# Patient Record
Sex: Female | Born: 1945 | Race: White | Hispanic: No | Marital: Married | Smoking: Never smoker
Health system: Southern US, Community
[De-identification: ages and names within clinical notes are randomized; demographics above are authoritative.]

## PROBLEM LIST (undated history)

## (undated) DIAGNOSIS — E119 Type 2 diabetes mellitus without complications: Secondary | ICD-10-CM

## (undated) DIAGNOSIS — F419 Anxiety disorder, unspecified: Secondary | ICD-10-CM

## (undated) DIAGNOSIS — K219 Gastro-esophageal reflux disease without esophagitis: Secondary | ICD-10-CM

## (undated) HISTORY — PX: NOSE SURGERY: SHX723

## (undated) HISTORY — PX: FOOT SURGERY: SHX648

## (undated) HISTORY — DX: Anxiety disorder, unspecified: F41.9

## (undated) HISTORY — PX: OTHER SURGICAL HISTORY: SHX169

## (undated) HISTORY — DX: Type 2 diabetes mellitus without complications: E11.9

## (undated) HISTORY — DX: Gastro-esophageal reflux disease without esophagitis: K21.9

---

## 2005-02-24 ENCOUNTER — Emergency Department: Payer: Self-pay | Admitting: Unknown Physician Specialty

## 2005-03-09 ENCOUNTER — Ambulatory Visit: Payer: Self-pay | Admitting: Internal Medicine

## 2005-03-26 ENCOUNTER — Ambulatory Visit: Payer: Self-pay | Admitting: Internal Medicine

## 2005-03-28 ENCOUNTER — Ambulatory Visit: Payer: Self-pay | Admitting: Internal Medicine

## 2005-05-27 ENCOUNTER — Ambulatory Visit: Payer: Self-pay

## 2006-04-21 ENCOUNTER — Ambulatory Visit: Payer: Self-pay | Admitting: Obstetrics and Gynecology

## 2007-04-25 ENCOUNTER — Ambulatory Visit: Payer: Self-pay | Admitting: Obstetrics and Gynecology

## 2007-07-04 ENCOUNTER — Ambulatory Visit: Payer: Self-pay | Admitting: Internal Medicine

## 2007-07-27 ENCOUNTER — Ambulatory Visit: Payer: Self-pay | Admitting: Specialist

## 2007-12-06 ENCOUNTER — Ambulatory Visit: Payer: Self-pay | Admitting: Specialist

## 2008-07-05 ENCOUNTER — Ambulatory Visit: Payer: Self-pay | Admitting: Obstetrics and Gynecology

## 2009-07-24 ENCOUNTER — Ambulatory Visit: Payer: Self-pay | Admitting: Obstetrics and Gynecology

## 2009-10-08 ENCOUNTER — Ambulatory Visit: Payer: Self-pay

## 2009-10-29 ENCOUNTER — Ambulatory Visit: Payer: Self-pay | Admitting: Pain Medicine

## 2009-11-25 ENCOUNTER — Ambulatory Visit: Payer: Self-pay | Admitting: Physician Assistant

## 2009-11-26 ENCOUNTER — Ambulatory Visit: Payer: Self-pay | Admitting: Pain Medicine

## 2009-12-12 ENCOUNTER — Ambulatory Visit: Payer: Self-pay | Admitting: Physician Assistant

## 2009-12-31 ENCOUNTER — Ambulatory Visit: Payer: Self-pay | Admitting: Pain Medicine

## 2010-01-13 ENCOUNTER — Ambulatory Visit: Payer: Self-pay | Admitting: Pain Medicine

## 2010-04-02 ENCOUNTER — Ambulatory Visit: Payer: Self-pay | Admitting: Internal Medicine

## 2010-07-28 ENCOUNTER — Ambulatory Visit: Payer: Self-pay | Admitting: Obstetrics and Gynecology

## 2011-09-09 ENCOUNTER — Ambulatory Visit: Payer: Self-pay | Admitting: Obstetrics and Gynecology

## 2011-12-06 ENCOUNTER — Emergency Department: Payer: Self-pay | Admitting: Unknown Physician Specialty

## 2011-12-16 ENCOUNTER — Ambulatory Visit: Payer: Self-pay | Admitting: Internal Medicine

## 2012-05-31 LAB — BASIC METABOLIC PANEL
Anion Gap: 9 (ref 7–16)
Calcium, Total: 9.5 mg/dL (ref 8.5–10.1)
Co2: 28 mmol/L (ref 21–32)
EGFR (African American): >60
EGFR (Non-African Amer.): >60
Glucose: 85 mg/dL (ref 65–99)
Potassium: 4.5 mmol/L (ref 3.5–5.1)
Sodium: 139 mmol/L (ref 136–145)

## 2012-05-31 LAB — CBC
HCT: 41.1 % (ref 35.0–47.0)
HGB: 13.8 g/dL (ref 12.0–16.0)
MCHC: 33.5 g/dL (ref 32.0–36.0)
RBC: 4.24 10*6/uL (ref 3.80–5.20)
RDW: 12.9 % (ref 11.5–14.5)

## 2012-05-31 LAB — CK TOTAL AND CKMB (NOT AT ARMC)
CK, Total: 39 U/L (ref 21–215)
CK-MB: <0.5 ng/mL — ABNORMAL LOW (ref 0.5–3.6)

## 2012-05-31 LAB — TROPONIN I: Troponin-I: <0.02 ng/mL

## 2012-06-01 ENCOUNTER — Observation Stay: Payer: Self-pay | Admitting: Internal Medicine

## 2012-06-01 LAB — T4, FREE: Free Thyroxine: 1.4 ng/dL (ref 0.76–1.46)

## 2012-06-01 LAB — LIPID PANEL
Cholesterol: 125 mg/dL (ref 0–200)
Ldl Cholesterol, Calc: 42 mg/dL (ref 0–100)

## 2012-06-01 LAB — TSH: Thyroid Stimulating Horm: 5.65 u[IU]/mL — ABNORMAL HIGH

## 2012-06-01 LAB — CK TOTAL AND CKMB (NOT AT ARMC)
CK, Total: 36 U/L (ref 21–215)
CK, Total: 45 U/L (ref 21–215)
CK-MB: <0.5 ng/mL — ABNORMAL LOW (ref 0.5–3.6)

## 2012-06-01 LAB — HEMOGLOBIN A1C: Hemoglobin A1C: 6.2 % (ref 4.2–6.3)

## 2012-09-12 ENCOUNTER — Ambulatory Visit: Payer: Self-pay | Admitting: Obstetrics and Gynecology

## 2013-07-27 ENCOUNTER — Ambulatory Visit: Payer: Self-pay | Admitting: Internal Medicine

## 2013-09-02 ENCOUNTER — Emergency Department: Payer: Self-pay | Admitting: Emergency Medicine

## 2013-11-09 ENCOUNTER — Ambulatory Visit: Payer: Self-pay | Admitting: Obstetrics and Gynecology

## 2014-02-01 ENCOUNTER — Encounter: Payer: Self-pay | Admitting: Podiatrist

## 2014-02-01 ENCOUNTER — Ambulatory Visit (INDEPENDENT_AMBULATORY_CARE_PROVIDER_SITE_OTHER): Payer: Medicare Other

## 2014-02-01 ENCOUNTER — Ambulatory Visit (INDEPENDENT_AMBULATORY_CARE_PROVIDER_SITE_OTHER): Payer: Medicare Other | Admitting: Podiatrist

## 2014-02-01 VITALS — BP 124/65 | HR 90 | Resp 16 | Ht 65.0 in | Wt 184.0 lb

## 2014-02-01 DIAGNOSIS — M79609 Pain in unspecified limb: Secondary | ICD-10-CM

## 2014-02-01 DIAGNOSIS — M7750 Other enthesopathy of unspecified foot: Secondary | ICD-10-CM

## 2014-02-01 DIAGNOSIS — M659 Synovitis and tenosynovitis, unspecified: Secondary | ICD-10-CM

## 2014-02-01 DIAGNOSIS — M79673 Pain in unspecified foot: Secondary | ICD-10-CM

## 2014-02-01 MED ORDER — TRIAMCINOLONE ACETONIDE 10 MG/ML IJ SUSP
10.0000 mg | Freq: Once | INTRAMUSCULAR | Status: AC
  Administered 2014-02-01: 10 mg

## 2014-02-01 NOTE — Patient Instructions (Signed)
Tendinitis Tendinitis is swelling and inflammation of the tendons. Tendons are band-like tissues that connect muscle to bone. Tendinitis commonly occurs in the:   Shoulders (rotator cuff).  Heels (Achilles tendon).  Elbows (triceps tendon).  feet CAUSES Tendinitis is usually caused by overusing the tendon, muscles, and joints involved. When the tissue surrounding a tendon (synovium) becomes inflamed, it is called tenosynovitis. Tendinitis commonly develops in people whose jobs require repetitive motions. SYMPTOMS  Pain.  Tenderness.  Mild swelling. DIAGNOSIS Tendinitis is usually diagnosed by physical exam. Your caregiver may also order X-rays or other imaging tests. TREATMENT Your caregiver may recommend certain medicines or exercises for your treatment. HOME CARE INSTRUCTIONS   Use a brace or splint for as long as directed by your caregiver until the pain decreases.  Put ice on the injured area.  Put ice in a plastic bag.  Place a towel between your skin and the bag.  Leave the ice on for 15-20 minutes, 03-04 times a day.  Avoid using the limb while the tendon is painful. Perform gentle range of motion exercises only as directed by your caregiver. Stop exercises if pain or discomfort increase, unless directed otherwise by your caregiver.  Only take over-the-counter or prescription medicines for pain, discomfort, or fever as directed by your caregiver. SEEK MEDICAL CARE IF:   Your pain and swelling increase.  You develop new, unexplained symptoms, especially increased numbness in the hands. MAKE SURE YOU:   Understand these instructions.  Will watch your condition.  Will get help right away if you are not doing well or get worse. Document Released: 12/11/2000 Document Revised: 03/07/2012 Document Reviewed: 03/02/2011 [facility] Patient Information 2014 [place], [place].

## 2014-02-01 NOTE — Progress Notes (Signed)
   Subjective:    Patient ID: Alyssa Hooper, female    DOB: 07/12/1946, 68 y.o.   MRN: [id number]  Patient presents today with a new onset pain of left lateral foot pain. She states she had an injury in September where she broke her right ankle on both sides. She states she was in a cast for 7 weeks followed by an ankle brace. She relates during the fall she twisted her left foot however it only started giving her pain of the left couple of months. She describes the pain as worsening and points to the peroneus brevis insertion of the area of discomfort. She's tried no treatment to the area.  HPI Comments: N pain L left foot lateral  D 2 months O sudden C worse A am bad, walking T 0  Foot Pain Associated symptoms include headaches.      Review of Systems  Constitutional:       Sweating  HENT:       Sinus problems sneezing  Eyes: Positive for itching.  Respiratory: Negative.   Cardiovascular: Positive for palpitations.  Gastrointestinal: Negative.   Genitourinary: Negative.   Musculoskeletal: Positive for back pain.  Skin: Negative.   Allergic/Immunologic: Positive for food allergies.  Neurological: Positive for headaches.  Hematological: Negative.   Psychiatric/Behavioral: Negative.        Objective:   Physical Exam GENERAL APPEARANCE: Alert, conversant. Appropriately groomed. No acute distress.  VASCULAR: Pedal pulses palpable at 2/4 DP and PT bilateral.  Capillary refill time is immediate to all digits,  Proximal to distal cooling it warm to warm.  Digital hair growth is present bilateral  NEUROLOGIC: sensation is intact epicritically and protectively to 5.07 monofilament at 5/5 sites bilateral.  Light touch is intact bilateral, vibratory sensation intact bilateral, achilles tendon reflex is intact bilateral.  MUSCULOSKELETAL: Pain on palpation lateral aspect of the left foot present along the peroneus brevis from the distal tip of the fibula coursing to the base of the  fifth metatarsal left. No pain at the fifth digit where her previous surgery was performed to straighten out the fifth toe.  DERMATOLOGIC: skin color, texture, and turger are within normal limits.  No preulcerative lesions are seen, no interdigital maceration noted.  No open lesions present.  Digital nails are asymptomatic.   X-rays are negative for any fracture or dislocation along the lateral foot    Assessment & Plan:  Assessment: Tendinitis peroneus brevis tendon left Plan: An injection was carried out today with Kenalog and Marcaine mixture superficial to the tendon itself but an area of maximal tenderness under sterile technique. The patient tolerated this well. She was given instructions for icing and elevation as well as utilizing her ankle brace on her left foot. If there is no improvement in 2 weeks she will call and we will consider physical therapy

## 2014-03-27 DIAGNOSIS — E7800 Pure hypercholesterolemia, unspecified: Secondary | ICD-10-CM | POA: Insufficient documentation

## 2014-03-27 DIAGNOSIS — M5137 Other intervertebral disc degeneration, lumbosacral region: Secondary | ICD-10-CM | POA: Insufficient documentation

## 2014-03-27 DIAGNOSIS — M545 Low back pain, unspecified: Secondary | ICD-10-CM | POA: Insufficient documentation

## 2014-03-27 DIAGNOSIS — R51 Headache: Secondary | ICD-10-CM

## 2014-03-27 DIAGNOSIS — N949 Unspecified condition associated with female genital organs and menstrual cycle: Secondary | ICD-10-CM | POA: Insufficient documentation

## 2014-03-27 DIAGNOSIS — E039 Hypothyroidism, unspecified: Secondary | ICD-10-CM | POA: Insufficient documentation

## 2014-03-27 DIAGNOSIS — J449 Chronic obstructive pulmonary disease, unspecified: Secondary | ICD-10-CM | POA: Insufficient documentation

## 2014-03-27 DIAGNOSIS — M76899 Other specified enthesopathies of unspecified lower limb, excluding foot: Secondary | ICD-10-CM | POA: Insufficient documentation

## 2014-03-27 DIAGNOSIS — R519 Headache, unspecified: Secondary | ICD-10-CM | POA: Insufficient documentation

## 2014-03-27 DIAGNOSIS — E119 Type 2 diabetes mellitus without complications: Secondary | ICD-10-CM | POA: Insufficient documentation

## 2014-03-27 DIAGNOSIS — I10 Essential (primary) hypertension: Secondary | ICD-10-CM | POA: Insufficient documentation

## 2014-08-22 ENCOUNTER — Ambulatory Visit: Payer: Self-pay | Admitting: Specialist

## 2014-08-28 ENCOUNTER — Ambulatory Visit: Payer: Self-pay | Admitting: Specialist

## 2014-10-22 ENCOUNTER — Ambulatory Visit: Payer: Self-pay | Admitting: Internal Medicine

## 2014-11-29 ENCOUNTER — Ambulatory Visit: Payer: Self-pay | Admitting: Obstetrics and Gynecology

## 2015-01-15 ENCOUNTER — Ambulatory Visit: Payer: Self-pay | Admitting: Gastroenterology

## 2015-03-19 ENCOUNTER — Encounter
Admit: 2015-03-19 | Disposition: A | Discharge status: Home / Self Care | Payer: Self-pay | Attending: Specialist | Admitting: Specialist

## 2015-03-29 ENCOUNTER — Encounter
Admit: 2015-03-29 | Disposition: A | Discharge status: Home / Self Care | Payer: Self-pay | Attending: Specialist | Admitting: Specialist

## 2015-04-20 NOTE — Op Note (Signed)
PATIENT NAME:  Alyssa Hooper, Alyssa Hooper MR#:  [id number] DATE OF BIRTH:  06-14-1946  DATE OF PROCEDURE:  08/28/2014   PREOPERATIVE DIAGNOSIS:  1.  Degenerative arthritis, base of right thumb carpometacarpal joint.  2.  Right carpal tunnel syndrome.   PROCEDURE PERFORMED:  1.  Excisional trapezium arthroplasty right thumb.  2.  Right carpal tunnel release.   SURGEON: Clare Gandyhristopher E. Santhiago Collingsworth, M.D.   ANESTHESIA: General.   COMPLICATIONS: None.   TOURNIQUET TIME: 82 minutes.   DESCRIPTION OF PROCEDURE:  Clindamycin 600 mg was given intravenously prior to the procedure. General anesthesia was induced. The right upper extremity was thoroughly prepped with alcohol and ChloraPrep, and draped in standard sterile fashion. The extremity was wrapped out with the Esmarch bandage and the pneumatic tourniquet elevated to 300 mmHg.   First, under loupe magnification, a standard volar carpal tunnel incision was made and the dissection was carefully carried down to the transverse retinacular ligament, which was incised in the midportion with the knife. The distal release was performed with the small scissors. The proximal release was performed with the small scissors and the carpal tunnel scissors. There was seen to be significant synovial hypertrophy present in the carpal tunnel. The median nerve was seen to be moderately compressed. Careful check was made both proximally and distally to ensure that complete release had been obtained. The wound was thoroughly irrigated multiple times. Skin edges were infiltrated with 0.5% plain Marcaine. Skin edges were closed with 4-0 nylon.   Attention was then turned to the base of the right thumb. A standard longitudinal dorsal incision was then made under loupe magnification, and the dissection carefully carried down to the dorsal capsule of the carpometacarpal joint. Cutaneous nerves and tendons were reflected to each side. The dorsal capsule was incised longitudinally and reflected  to each side and preserved. The trapezium was dissected out with the knife and the periosteal elevator. This was then enucleated using the bur. The remainder of the trapezium was excised using the small rongeur. The wound was thoroughly irrigated multiple times. Hemostasis was maintained at all times with the Bovie. Three pieces of Gelfoam were then folded and compressed into the form of a trapezium, and sutured in that fashion with 4-0 Mersilene. This was then secured to the base of the wound through the tendon at the base and tied down tightly. The dorsal capsule was then repaired using multiple 4-0 Mersilene sutures. Skin edges were infiltrated with 0.5% plain Marcaine. Subcutaneous tissue was closed with one 3-0 Vicryl suture. The skin was closed with a running subcuticular 4-0 nylon.   A soft bulky dressing was applied, along with a short arm splint. The tourniquet was released. The patient was returned to the recovery room in satisfactory condition, having tolerated the procedure quite well.    ____________________________ Clare Gandyhristopher E. Samarie Pinder, MD ces:MT D: 08/28/2014 10:00:47 ET T: 08/28/2014 10:39:54 ET JOB#: [id number]  cc: Clare Gandyhristopher E. Dixon Luczak, MD, <Dictator> Clare GandyHRISTOPHER E Gwenith Tschida MD ELECTRONICALLY SIGNED 08/31/2014 6:39

## 2015-04-21 NOTE — Discharge Summary (Signed)
PATIENT NAME:  Alyssa Hooper, Alyssa Hooper MR#:  [id number] DATE OF BIRTH:  10-09-1946  DATE OF ADMISSION:  06/01/2012 DATE OF DISCHARGE:  06/01/2012  PRIMARY CARE PHYSICIAN: Dr. Dewaine Oatsenny Tate.   REASON FOR ADMISSION: Chest pain and shortness of breath.   DISCHARGE DIAGNOSES:  1. Chest pain and shortness of breath with exact etiology unclear with negative cardiac enzymes and negative Myoview, chest pain possibly related to anxiety and stress.  2. Dyslipidemia with hypercholesterolemia and mild hypertriglyceridemia.  3. Type 2 diabetes mellitus.  4. Depression/anxiety.  5. Obesity.  6. Hypothyroidism.  7. History of heart palpitations.   CONSULTATION: Cardiology, Dr. Darrold JunkerParaschos.   DISCHARGE DISPOSITION: Home.   DISCHARGE MEDICATIONS:  1. Aspirin 81 mg p.o. daily.  2. Nitrostat 0.4 mg sublingually every five minutes x3 p.r.n. chest pain. 3. Prilosec 20 mg p.o. daily.  4. Levothyroxine 75 mcg daily. 5. Trazodone 300 mg at bedtime.  6. Simvastatin 40 mg daily. 7. Xanax 0.5 mg p.o. t.i.d. p.r.n.  8. Metformin 500 mg p.o. b.i.d.  9. Atenolol 25 mg p.o. daily.  10. Glimepiride 1 mg p.o. daily. 11. Lisinopril 5 mg p.o. daily.   DISCHARGE CONDITION: Improved, stable.   DISCHARGE ACTIVITY: As tolerated.   DISCHARGE DIET: Low sodium, ADA, low fat, low cholesterol.   DISCHARGE INSTRUCTIONS:  1. Take medications as prescribed.  2. Return to the Emergency Department for recurrence of symptoms.   FOLLOW-UP INSTRUCTIONS:  1. Follow-up with Dr. Lady GaryFath within 1 to 2 weeks.  2. Follow-up with Dr. Arlana Pouchate within 1 to 2 weeks. Recommend repeat TSH in the next 2 to 3 weeks.   PROCEDURES:  1. Chest CT with contrast 05/31/2012: No evidence of thoracic aortic dissection, no evidence of pulmonary embolus. There is patchy increased density noted anteromedially in the left upper lobe which may be mild bronchiectatic changes in the left upper lobe  as well. No alveolar infiltrates are noted.  2. Treadmill Myoview  06/01/2012: Negative ETT. There is normal LV function, normal wall motion. No evidence of scar or ischemia.   PERTINENT LABS/STUDIES: Cardiac enzymes negative x3 sets. TSH was slightly elevated at 5.65, but FT4 and FT3 were within normal limits. Lipid panel: Total cholesterol 125, triglycerides 212, HDL 41, LDL 42, hemoglobin A1c 6.2. BMP normal on admission. CBC normal on admission. EKG 05/31/2012: Normal sinus rhythm with heart rate of 66 beats per minute, normal axis, normal intervals. There were no acute ST or T wave changes.   BRIEF HISTORY/HOSPITAL COURSE: The patient is a pleasant 69 year old female with past medical history of obesity, type 2 diabetes mellitus, heart palpitations, dyslipidemia, hypothyroidism, depression, anxiety, as well as heart palpitations who presented to the Emergency Department with complaints of chest pain and shortness of breath. Please see dictated admission history and physical for pertinent details surrounding the onset of this hospitalization. Please see below for further details.   Chest pain and shortness of breath, exact etiology unclear. Initially, it was felt this could be cardiac-related given the patient's risk factors. EKG was nondiagnostic at the time of admission and first set of cardiac enzymes were negative. Since she has risk factors for coronary artery disease, she was started on aspirin therapy and admitted for further work-up and evaluation. The patient has ruled out for myocardial infarction based on three negative sets of cardiac enzymes. She was initially placed on oxygen, aspirin and maintained on beta blocker therapy and given pain control and with the measures mentioned above, her chest pain and shortness of breath had  eventually resolved. She was seen by Dr. Darrold Junker of cardiology who was in agreement with overall medical management plan and starting aspirin. Dr. Darrold Junker recommended Myoview for further assessment. The patient went for exercise  treadmill test/Myoview on date mentioned above which is a negative scan and she had no wall motion abnormalities or any evidence of ischemia and had normal ejection fraction. She has ruled out for a myocardial infarction based on three negative sets of cardiac enzymes and has undergone a negative cardiac stress test.   In regards to her shortness of breath, her oxygen saturations remained stable throughout this admission on room air. At the time of discharge, she is without any chest pain or shortness of breath at rest or with ambulation. Additionally, chest CT was obtained which was negative for aortic dissection or PE but revealed unspecified patchy density in the left upper lobe which may be related to mild bronchiectatic change, but there is no concern for pneumonia as she was without any cough, fever, or chills and had a normal WBC count. She will need to have this closely monitored as an outpatient by her primary care physician. Would recommend repeat chest CT in 6 to 12 months to ensure stability to ensure resolution. If this change remains persistent, would recommend further evaluation and work-up and referral to pulmonology as an outpatient. The patient was in agreement with this plan. The exact etiology of her chest pain and shortness of breath is unclear at this time, but the patient feels it may be related to stress and anxiety which may very well be the case. Her anxiolytic regimen has been adjusted and she was taking therapy in the morning and night and now she is advised to take it three times a day as needed, morning, afternoon and night as needed to which she agreed. She denied any suicidal or homicidal ideation. She will need close outpatient follow up with her primary care physician and cardiologist. In the meanwhile, given risk factors for coronary artery disease and for primary prevention, she has been started on aspirin therapy and will need aggressive risk factor modification.   In regards  to her diabetes mellitus, this is at goal with a hemoglobin A1c of 6.2 and she will continue glimepiride and metformin. I am not sure if she has microalbuminuria, but she is on an ACE inhibitor therapy and she was advised to continue this and to have close outpatient follow up with her primary care physician. She states she has never been diagnosed with hypertension. She is also on atenolol but states she was started on this due to history of heart palpitations. Telemetry monitoring during the entirety of this hospitalization has revealed persistent normal sinus rhythm without any arrhythmias. In the event of gastroesophageal reflux disease related chest pain, she was advised to start a trial of empiric PPI therapy.   In regards to dyslipidemia with hypercholesterolemia and hypertriglyceridemia, she was advised to continue her statin therapy and dietary control of her hypertriglycerides and did not want to start her on fibrate along with a statin as there is an increased risk of myopathy with concomitant use of fibrates and statin therapy.   For depression and anxiety, she will continue trazodone and benzodiazepine therapy as needed as mentioned above.   For hypothyroidism, her TSH was slightly elevated, but had normal FT3 and FT4 which may indicate possible sick euthyroid syndrome. I would recommend repeat TSH by her primary care physician within 2 to 3 weeks.   On 06/01/2012,  the patient was hemodynamically stable without any chest pain or shortness of breath and was felt to be stable for discharge home with close outpatient follow-up to which the patient was agreeable.   TIME SPENT ON DISCHARGE: Greater than 30 minutes.    ____________________________ Elon Alas, MD knl:ap D: 06/04/2012 16:44:40 ET T: 06/06/2012 12:20:43 ET JOB#: [id number]  cc: Elon Alas, MD, <Dictator> Jillene Bucks Arlana Pouch, MD Darlin Priestly Lady Gary, MD Elon Alas MD ELECTRONICALLY SIGNED 06/13/2012 2:26

## 2015-04-21 NOTE — H&P (Signed)
PATIENT NAME:  Alyssa Hooper, Alyssa Hooper MR#:  [id number] DATE OF BIRTH:  [birth year]  DATE OF ADMISSION:  05/31/2012  REFERRING PHYSICIAN: Maricela Bo, MD  PRIMARY CARE PHYSICIAN:  Dewaine Oats, MD   PRESENTING COMPLAINT: Chest pain, shortness of breath.   HISTORY OF PRESENT ILLNESS: Alyssa Hooper is a 69 year old woman with history of depression and anxiety, diabetes, hypothyroidism, dyslipidemia, and obesity who presents today with reports of developing chest pain that began a month ago. She describes it as sharp pain in the middle of her chest that extends across both sides of her chest. Reports that it would occur at rest or on exertion. It has been intermittent at best and reports increase in symptoms by 5 days ago. She started having severe pain in the middle of her chest with again extension to both sides with right side jaw pain and headache as well as pain between her shoulder blades. She also reports pain lasting longer than usual, about 45 minutes, associated with sweats and shortness of breath. Reports that up to then she had been getting the chest pain episodes 10 times per day. She saw her primary physician on Monday and was referred to see Dr. Lady Gary on 06/01/2012, however, she presented today with reports of recurrent chest pain, again similar as the last episode, but continues to increase in intensity. She reports still having chest pressure, but symptoms are improved. She took aspirin and a nerve pill with some improvement. She reports palpitations at baseline but also reports nausea but no vomiting.   PAST MEDICAL HISTORY:  1. Obesity.  2. Depression/anxiety.  3. Carpal tunnel syndrome.  4. Migraines.  5. Leg pain and sciatica.  6. Diabetes.  7. Dyslipidemia.  8. Hypothyroidism.  9. Palpitations.  10. Colonoscopy from December 2003 showed diverticulosis.   PAST SURGICAL HISTORY:  1. Foot surgery.  2. Dilation and curettage.  3. Septoplasty. 4. Hysterectomy.   5. Cholecystectomy.  ALLERGIES: NSAIDs, penicillin, and TriCor.   MEDICATIONS:  1. Atenolol 25 mg daily.  2. Glimepiride 1 mg daily.  3. Levothyroxine 75 mcg daily.  4. Lisinopril 5 mg daily.  5. Metformin 500 mg twice a day. 6. Simvastatin 40 mg daily.  7. Trazodone 300 mg at bedtime.  8. Xanax 0.5 mg in the morning and 1 mg at bedtime.   FAMILY HISTORY: She does not know her family or other history.   SOCIAL HISTORY: She lives in [place] with her husband. She denies any tobacco, alcohol, or drug use. She was a hairstylist.  REVIEW OF SYSTEMS: CONSTITUTIONAL: No fevers. Endorses nausea but no vomiting. EYES: No visual disturbance. ENT: Denies any epistaxis or discharge. RESPIRATORY: Reports nonproductive dry intermittent cough, also for the past one month. No hemoptysis. Reports shortness of breath. Chest pain as per history of present illness. CARDIOVASCULAR: As per history of present illness. Denies any syncope. GASTROINTESTINAL: Endorses nausea but no vomiting. Reports she is lactose intolerance and has diarrhea intermittently. No abdominal pain, hematemesis, or melena. GU: No dysuria or hematuria. ENDOCRINE: No polyuria or polydipsia. HEMATOLOGIC: No bleeding. SKIN: No ulcers. MUSCULOSKELETAL: She has a pinched nerve with leg pain and sciatica. NEUROLOGIC: No history of strokes or seizures. PSYCHIATRIC: She endorses depression and anxiety, but no suicidal ideation.   PHYSICAL EXAMINATION:   VITAL SIGNS: Temperature 97.3, pulse 64, respiratory rate 18, blood pressure 135/64, and saturating 100% on room air.   GENERAL: Lying in bed in no apparent distress.   HEENT: Normocephalic, atraumatic. Pupils are equal, symmetric, anicteric. Moist  mucous membranes.   NECK: Soft and supple. No adenopathy or JVP.   CARDIOVASCULAR: Non-tachy. No murmurs, rubs, or gallops.   LUNGS: Clear to auscultation bilaterally. No use of accessory muscles or increased respiratory effort.   ABDOMEN:  Soft. Positive bowel sounds. No mass appreciated.   EXTREMITIES: No edema. Dorsal pedis pulses intact.   MUSCULOSKELETAL: No joint effusion.   SKIN: No ulcers.   NEUROLOGIC: No dysarthria or aphasia. Symmetrical strength. No focal deficits.   PERTINENT LABS AND STUDIES: CT of the chest shows no thoracic aortic dissection or acute PE. There is abnormal appearance of the left upper lobe with patchy areas of interstitial density and associated upper left hilar lymphadenopathy. There is mildly enlarged mediastinal lymph nodes. This is unchanged compared to December of 2008. This may reflect presence of chronically abnormal lung parenchyma with superimposed infection today. The mildly enlarged mediastinal lymph nodes and the large peripheral left hilar lymph nodes appear stable. There are minimal interstitial changes also in the right lung. No evidence of pneumothorax.  WBC 6.3, hemoglobin 13.8, hematocrit 41.1, platelet 263, and MCV 97. Glucose 85, BUN 18, creatinine 0.97, sodium 139, potassium 4.5, chloride 102, carbon dioxide 28, and calcium 9.5. Troponin less than 0.02. CK 39. MB less than 0.5.   EKG with normal sinus rate of 67. No ST elevation or depression. T wave inversion in aVR and V1 that is nonspecific.   ASSESSMENT AND PLAN: Ms. Alyssa Hooper is a 69 year old woman with history of diabetes, dyslipidemia, obesity, depression, anxiety, hypothyroidism, and palpitations presenting with complaints of chest pain.  1. Atypical chest pain: CT of the chest is negative for PE and dissection, as above. Was intended to establish here with care with Dr. Lady GaryFath, but presented to the ER with recurrent chest pain with increasing in frequency and intensity. She does have multiple risk factors. We will continue on tele, cycle cardiac enzymes, we will start on aspirin, restart her simvastatin and atenolol, continue oxygen and nitroglycerin sublingual as needed. We will send TSH, fasting lipid panel, and A1c for risk  stratification. Obtain cardiology consultation. We will order for a treadmill stress test.  2. Hypothyroidism: Resume her levothyroxine.  3. Diabetes: Hold metformin. We will start her glimepiride and start on sliding scale insulin.  4. Depression/anxiety: This may be perpetuating her symptoms. We will resume her trazodone and Xanax.  5. Prophylaxis with aspirin and Lovenox.   TIME SPENT: Approximately 45 minutes was spent on patient care. ____________________________ Reuel DerbyAlounthith Larisha Vencill, MD ap:slb D: 06/01/2012 00:20:00 ET T: 06/01/2012 07:11:15 ET JOB#: [id number]  cc: Pearlean BrownieAlounthith Aksh Swart, MD, <Dictator> Jillene Bucksenny C. Arlana Pouchate, MD Reuel DerbyALOUNTHITH Berkeley Veldman MD ELECTRONICALLY SIGNED 06/07/2012 3:22

## 2015-04-21 NOTE — Consult Note (Signed)
PATIENT NAME:  Alyssa Hooper, Alyssa Hooper MR#:  [id number] DATE OF BIRTH:  1946-04-16  DATE OF CONSULTATION:  06/01/2012  REFERRING PHYSICIAN:   CONSULTING PHYSICIAN:  Marcina MillardAlexander Kinneth Fujiwara, MD  PRIMARY CARE PHYSICIAN: Dr. Arlana Pouchate  CHIEF COMPLAINT: Chest pain.   HISTORY OF PRESENT ILLNESS: Patient is a 69 year old female with multiple cardiovascular risk factors who presents with chief complaint of chest pain. Patient developed substernal chest pain described as sharp pain which has been intermittent over a month and increasing in frequency during the past several days. Patient was referred to see Dr. Lady GaryFath as an outpatient but was seen in [facility] Room and admitted to telemetry where she ruled out for myocardial infarction by CPK isoenzymes and troponin.   PAST MEDICAL HISTORY:  1. Hyperlipidemia.  2. Diabetes. 3. Obesity.  4. Depression.  5. History of migraine headaches.  6. Hypothyroidism.   MEDICATIONS ON ADMISSION: 1. Atenolol 25 mg daily.  2. Lisinopril 5 mg daily.  3. Simvastatin 40 mg daily.  4. Glimepiride 1 mg daily.  5. Levothyroxine 75 mcg daily.  6. Metformin 500 mg b.i.d.  7. Trazodone 300 mg at bedtime.  8. Xanax 0.5 mg b.i.d. p.r.n.   SOCIAL HISTORY: Patient is married. She lives with her husband. She denies tobacco abuse.   FAMILY HISTORY: No immediate family history of coronary disease or myocardial infarction.   REVIEW OF SYSTEMS: CONSTITUTIONAL: No fever or chills. EYES: No blurry vision. EARS: No hearing loss. RESPIRATORY: No shortness of breath. CARDIOVASCULAR: Patient denies chest pain. GASTROINTESTINAL: Patient denies nausea, vomiting, diarrhea, constipation. GENITOURINARY: No dysuria, hematuria. ENDOCRINE: No polyuria or polydipsia. HEMATOLOGICAL: No bleeding or easy bruising. MUSCULOSKELETAL: Patient denies arthralgias, myalgias. NEUROLOGICAL: Patient denies focal muscle weakness or numbness. PSYCHOLOGICAL: Patient denies anxiety or  depression.   PHYSICAL EXAMINATION:  VITAL SIGNS: Blood pressure 96/64, pulse 69, respirations 17, temperature 97.8, pulse oximetry 95%.   HEENT: Pupils equal, reactive to light and accommodation.   NECK: Supple without thyromegaly.   LUNGS: Clear.   CARDIOVASCULAR: Normal jugular venous pressure. Normal point of maximal impulse. Regular rate and rhythm. Normal S1, S2. No appreciable gallop, murmur, or rub.   ABDOMEN: Soft and nontender. Pulses were intact bilaterally.   MUSCULOSKELETAL: Normal muscle tone.   NEUROLOGIC: Patient is alert and oriented x3. Motor and sensory both grossly intact.   IMPRESSION: 69 year old female with new onset chest pain, has ruled out for myocardial infarction by CPK isoenzymes and troponin.   RECOMMENDATIONS:  1. Agree with overall current therapy.  2. Agree with ETT sestamibi study. 3. Further recommendations pending results of ETT sestamibi study. ____________________________ Marcina MillardAlexander Chaquana Nichols, MD ap:cms D: 06/01/2012 16:00:06 ET T: 06/01/2012 16:12:58 ET  JOB#: [id number] Lyn HollingsheadALEXANDER Andrina Locken MD ELECTRONICALLY SIGNED 06/09/2012 17:41

## 2015-04-22 LAB — SURGICAL PATHOLOGY

## 2016-03-17 ENCOUNTER — Other Ambulatory Visit: Payer: Self-pay | Admitting: Internal Medicine

## 2016-03-17 DIAGNOSIS — Z1231 Encounter for screening mammogram for malignant neoplasm of breast: Secondary | ICD-10-CM

## 2016-03-27 ENCOUNTER — Ambulatory Visit: Payer: Self-pay

## 2016-04-07 ENCOUNTER — Other Ambulatory Visit: Payer: Self-pay | Admitting: Internal Medicine

## 2016-04-07 ENCOUNTER — Ambulatory Visit
Admission: RE | Admit: 2016-04-07 | Discharge: 2016-04-07 | Disposition: A | Discharge status: Home / Self Care | Payer: Medicare Other | Source: Ambulatory Visit | Attending: Internal Medicine | Admitting: Internal Medicine

## 2016-04-07 DIAGNOSIS — Z1231 Encounter for screening mammogram for malignant neoplasm of breast: Secondary | ICD-10-CM

## 2017-05-11 ENCOUNTER — Other Ambulatory Visit: Payer: Self-pay | Admitting: Internal Medicine

## 2017-05-11 DIAGNOSIS — Z1231 Encounter for screening mammogram for malignant neoplasm of breast: Secondary | ICD-10-CM

## 2017-06-08 ENCOUNTER — Ambulatory Visit
Admission: RE | Admit: 2017-06-08 | Discharge: 2017-06-08 | Disposition: A | Discharge status: Home / Self Care | Payer: Medicare Other | Source: Ambulatory Visit | Attending: Internal Medicine | Admitting: Internal Medicine

## 2017-06-08 DIAGNOSIS — Z1231 Encounter for screening mammogram for malignant neoplasm of breast: Secondary | ICD-10-CM | POA: Diagnosis present

## 2017-07-14 ENCOUNTER — Telehealth: Payer: Self-pay | Admitting: Obstetrics and Gynecology

## 2017-07-14 NOTE — Telephone Encounter (Signed)
Pt aware I did not call her. I believe it was a reminder for her appointment tomorrow.

## 2017-07-14 NOTE — Telephone Encounter (Signed)
Patient called stating that she missed a call from Dr. Fawn Kirke/Crystal Miller. Patient would like a call back "when it is convenient for you all", Patient did not disclose any other information, Please advise.

## 2017-07-15 ENCOUNTER — Encounter: Payer: Self-pay | Admitting: Obstetrics and Gynecology

## 2017-07-15 ENCOUNTER — Ambulatory Visit (INDEPENDENT_AMBULATORY_CARE_PROVIDER_SITE_OTHER): Payer: Medicare Other | Admitting: Obstetrics and Gynecology

## 2017-07-15 VITALS — BP 115/66 | HR 101 | Ht 65.0 in | Wt 178.1 lb

## 2017-07-15 DIAGNOSIS — Z9889 Other specified postprocedural states: Secondary | ICD-10-CM | POA: Diagnosis not present

## 2017-07-15 DIAGNOSIS — N898 Other specified noninflammatory disorders of vagina: Secondary | ICD-10-CM | POA: Diagnosis not present

## 2017-07-15 DIAGNOSIS — Z780 Asymptomatic menopausal state: Secondary | ICD-10-CM | POA: Diagnosis not present

## 2017-07-15 DIAGNOSIS — R638 Other symptoms and signs concerning food and fluid intake: Secondary | ICD-10-CM

## 2017-07-15 NOTE — Addendum Note (Signed)
Addended by: Marchelle FolksMILLER, Alandra Sando G on: 07/15/2017 04:22 PM   Modules accepted: Orders

## 2017-07-15 NOTE — Patient Instructions (Addendum)
1. No Pap smear is done 2. Mammogram already obtain this year 3. Stool guaiac cards are given for colon cancer screening 4. Continue with healthy eating and exercise 5. Recommend calcium with vitamin D supplementation daily-1200 mg/1000 international units 6. Nu swab of the vagina is obtained 7.Return in 1 year for annual exam   Health Maintenance for Postmenopausal Women Menopause is a normal process in which your reproductive ability comes to an end. This process happens gradually over a span of months to years, usually between the ages of 16 and 10. Menopause is complete when you have missed 12 consecutive menstrual periods. It is important to talk with your health care provider about some of the most common conditions that affect postmenopausal women, such as heart disease, cancer, and bone loss (osteoporosis). Adopting a healthy lifestyle and getting preventive care can help to promote your health and wellness. Those actions can also lower your chances of developing some of these common conditions. What should I know about menopause? During menopause, you may experience a number of symptoms, such as:  Moderate-to-severe hot flashes.  Night sweats.  Decrease in sex drive.  Mood swings.  Headaches.  Tiredness.  Irritability.  Memory problems.  Insomnia.  Choosing to treat or not to treat menopausal changes is an individual decision that you make with your health care provider. What should I know about hormone replacement therapy and supplements? Hormone therapy products are effective for treating symptoms that are associated with menopause, such as hot flashes and night sweats. Hormone replacement carries certain risks, especially as you become older. If you are thinking about using estrogen or estrogen with progestin treatments, discuss the benefits and risks with your health care provider. What should I know about heart disease and stroke? Heart disease, heart attack, and  stroke become more likely as you age. This may be due, in part, to the hormonal changes that your body experiences during menopause. These can affect how your body processes dietary fats, triglycerides, and cholesterol. Heart attack and stroke are both medical emergencies. There are many things that you can do to help prevent heart disease and stroke:  Have your blood pressure checked at least every 1-2 years. High blood pressure causes heart disease and increases the risk of stroke.  If you are 7-63 years old, ask your health care provider if you should take aspirin to prevent a heart attack or a stroke.  Do not use any tobacco products, including cigarettes, chewing tobacco, or electronic cigarettes. If you need help quitting, ask your health care provider.  It is important to eat a healthy diet and maintain a healthy weight. ? Be sure to include plenty of vegetables, fruits, low-fat dairy products, and lean protein. ? Avoid eating foods that are high in solid fats, added sugars, or salt (sodium).  Get regular exercise. This is one of the most important things that you can do for your health. ? Try to exercise for at least 150 minutes each week. The type of exercise that you do should increase your heart rate and make you sweat. This is known as moderate-intensity exercise. ? Try to do strengthening exercises at least twice each week. Do these in addition to the moderate-intensity exercise.  Know your numbers.Ask your health care provider to check your cholesterol and your blood glucose. Continue to have your blood tested as directed by your health care provider.  What should I know about cancer screening? There are several types of cancer. Take the following steps  to reduce your risk and to catch any cancer development as early as possible. Breast Cancer  Practice breast self-awareness. ? This means understanding how your breasts normally appear and feel. ? It also means doing regular  breast self-exams. Let your health care provider know about any changes, no matter how small.  If you are 56 or older, have a clinician do a breast exam (clinical breast exam or CBE) every year. Depending on your age, family history, and medical history, it may be recommended that you also have a yearly breast X-ray (mammogram).  If you have a family history of breast cancer, talk with your health care provider about genetic screening.  If you are at high risk for breast cancer, talk with your health care provider about having an MRI and a mammogram every year.  Breast cancer (BRCA) gene test is recommended for women who have family members with BRCA-related cancers. Results of the assessment will determine the need for genetic counseling and BRCA1 and for BRCA2 testing. BRCA-related cancers include these types: ? Breast. This occurs in males or females. ? Ovarian. ? Tubal. This may also be called fallopian tube cancer. ? Cancer of the abdominal or pelvic lining (peritoneal cancer). ? Prostate. ? Pancreatic.  Cervical, Uterine, and Ovarian Cancer Your health care provider may recommend that you be screened regularly for cancer of the pelvic organs. These include your ovaries, uterus, and vagina. This screening involves a pelvic exam, which includes checking for microscopic changes to the surface of your cervix (Pap test).  For women ages 21-65, health care providers may recommend a pelvic exam and a Pap test every three years. For women ages 28-65, they may recommend the Pap test and pelvic exam, combined with testing for human papilloma virus (HPV), every five years. Some types of HPV increase your risk of cervical cancer. Testing for HPV may also be done on women of any age who have unclear Pap test results.  Other health care providers may not recommend any screening for nonpregnant women who are considered low risk for pelvic cancer and have no symptoms. Ask your health care provider if a  screening pelvic exam is right for you.  If you have had past treatment for cervical cancer or a condition that could lead to cancer, you need Pap tests and screening for cancer for at least 20 years after your treatment. If Pap tests have been discontinued for you, your risk factors (such as having a new sexual partner) need to be reassessed to determine if you should start having screenings again. Some women have medical problems that increase the chance of getting cervical cancer. In these cases, your health care provider may recommend that you have screening and Pap tests more often.  If you have a family history of uterine cancer or ovarian cancer, talk with your health care provider about genetic screening.  If you have vaginal bleeding after reaching menopause, tell your health care provider.  There are currently no reliable tests available to screen for ovarian cancer.  Lung Cancer Lung cancer screening is recommended for adults 36-73 years old who are at high risk for lung cancer because of a history of smoking. A yearly low-dose CT scan of the lungs is recommended if you:  Currently smoke.  Have a history of at least 30 pack-years of smoking and you currently smoke or have quit within the past 15 years. A pack-year is smoking an average of one pack of cigarettes per day for one  year.  Yearly screening should:  Continue until it has been 15 years since you quit.  Stop if you develop a health problem that would prevent you from having lung cancer treatment.  Colorectal Cancer  This type of cancer can be detected and can often be prevented.  Routine colorectal cancer screening usually begins at age 71 and continues through age 37.  If you have risk factors for colon cancer, your health care provider may recommend that you be screened at an earlier age.  If you have a family history of colorectal cancer, talk with your health care provider about genetic screening.  Your health  care provider may also recommend using home test kits to check for hidden blood in your stool.  A small camera at the end of a tube can be used to examine your colon directly (sigmoidoscopy or colonoscopy). This is done to check for the earliest forms of colorectal cancer.  Direct examination of the colon should be repeated every 5-10 years until age 29. However, if early forms of precancerous polyps or small growths are found or if you have a family history or genetic risk for colorectal cancer, you may need to be screened more often.  Skin Cancer  Check your skin from head to toe regularly.  Monitor any moles. Be sure to tell your health care provider: ? About any new moles or changes in moles, especially if there is a change in a mole's shape or color. ? If you have a mole that is larger than the size of a pencil eraser.  If any of your family members has a history of skin cancer, especially at a young age, talk with your health care provider about genetic screening.  Always use sunscreen. Apply sunscreen liberally and repeatedly throughout the day.  Whenever you are outside, protect yourself by wearing long sleeves, pants, a wide-brimmed hat, and sunglasses.  What should I know about osteoporosis? Osteoporosis is a condition in which bone destruction happens more quickly than new bone creation. After menopause, you may be at an increased risk for osteoporosis. To help prevent osteoporosis or the bone fractures that can happen because of osteoporosis, the following is recommended:  If you are 26-47 years old, get at least 1,000 mg of calcium and at least 600 mg of vitamin D per day.  If you are older than age 67 but younger than age 35, get at least 1,200 mg of calcium and at least 600 mg of vitamin D per day.  If you are older than age 39, get at least 1,200 mg of calcium and at least 800 mg of vitamin D per day.  Smoking and excessive alcohol intake increase the risk of  osteoporosis. Eat foods that are rich in calcium and vitamin D, and do weight-bearing exercises several times each week as directed by your health care provider. What should I know about how menopause affects my mental health? Depression may occur at any age, but it is more common as you become older. Common symptoms of depression include:  Low or sad mood.  Changes in sleep patterns.  Changes in appetite or eating patterns.  Feeling an overall lack of motivation or enjoyment of activities that you previously enjoyed.  Frequent crying spells.  Talk with your health care provider if you think that you are experiencing depression. What should I know about immunizations? It is important that you get and maintain your immunizations. These include:  Tetanus, diphtheria, and pertussis (Tdap) booster vaccine.  Influenza every year before the flu season begins.  Pneumonia vaccine.  Shingles vaccine.  Your health care provider may also recommend other immunizations. This information is not intended to replace advice given to you by your health care provider. Make sure you discuss any questions you have with your health care provider. Document Released: 02/05/2006 Document Revised: 07/03/2016 Document Reviewed: 09/17/2015 Elsevier Interactive Patient Education  2018 [employer].

## 2017-07-15 NOTE — Progress Notes (Signed)
ANNUAL PREVENTATIVE CARE GYN  ENCOUNTER NOTE  Subjective:       Alyssa Hooper is a 71 y.o. G7P2012 female here for a routine annual gynecologic exam.  Current complaints: 1.  D/c- milky foul odor x 1 month  No history of abnormal Pap smears. No sexual activity in the past 25-30 years due to husband having stage IV prostate cancer; he is currently 25 and in fair health. No history of abnormal Pap smears.   Gynecologic History No LMP recorded. Patient is postmenopausal. Contraception: abstinence Last Pap: 2015 wnl- no h/o abn. Results were: normal Last mammogram: 2018 wnl. Results were: normal  Menarche: Age 48 Menopause: Age 1 History of endometrial ablation age 21; history of short-term hormone replacement therapy with vaginal bleeding, medication discontinued and bleeding stopped  Obstetric History OB History  Gravida Para Term Preterm AB Living  2 2 2     2   SAB TAB Ectopic Multiple Live Births          2    # Outcome Date GA Lbr Len/2nd Weight Sex Delivery Anes PTL Lv  2 Term 1971   7 lb 9.6 oz (3.447 kg) F Vag-Spont   LIV  1 Term 1970   7 lb (3.175 kg) M Vag-Spont   LIV      Past Medical History:  Diagnosis Date  . Acid reflux   . Anxiety   . Diabetes [facility])     Past Surgical History:  Procedure Laterality Date  . dysplasia    . FOOT SURGERY Left   . NOSE SURGERY      Current Outpatient Prescriptions on File Prior to Visit  Medication Sig Dispense Refill  . ALPRAZolam (XANAX) 0.5 MG tablet     . glimepiride (AMARYL) 1 MG tablet     . levothyroxine (SYNTHROID, LEVOTHROID) 75 MCG tablet     . metFORMIN (GLUCOPHAGE) 500 MG tablet     . simvastatin (ZOCOR) 40 MG tablet     . traMADol (ULTRAM) 50 MG tablet Take by mouth as needed.    . traZODone (DESYREL) 100 MG tablet      No current facility-administered medications on file prior to visit.     Allergies  Allergen Reactions  . Shellfish Allergy Anaphylaxis  . Eggs Or Egg-Derived Products Other (See  Comments)  . Lac Bovis Other (See Comments)    Lactose Intolerant, uses Silk.  Marland Kitchen Penicillins Swelling  . Strawberry (Diagnostic) Hives    Social History   Social History  . Marital status: Married    Spouse name: N/A  . Number of children: N/A  . Years of education: N/A   Occupational History  . Not on file.   Social History Main Topics  . Smoking status: Never Smoker  . Smokeless tobacco: Not on file  . Alcohol use No  . Drug use: Unknown  . Sexual activity: Not on file   Other Topics Concern  . Not on file   Social History Narrative  . No narrative on file    Family History  Problem Relation Age of Onset  . Breast cancer Neg Hx     The following portions of the patient's history were reviewed and updated as appropriate: allergies, current medications, past family history, past medical history, past social history, past surgical history and problem list.  Review of Systems Review of Systems  Constitutional:       No vasomotor symptoms  HENT: Negative.   Eyes: Negative.   Respiratory: Negative.  Cardiovascular: Negative.   Gastrointestinal: Positive for diarrhea.       Patient is going to Dr. Arlana Hooper for evaluation of diarrhea  Genitourinary: Negative.        History of recurrent UTIs; approximately 3 weeks UTIs per year One-month history of milky discharge with slight odor  Musculoskeletal: Negative.   Skin: Negative.   Neurological: Negative.   Endo/Heme/Allergies: Negative.   Psychiatric/Behavioral: Negative.       Objective:   BP 115/66   Pulse (!) 101   Ht 5\' 5"  (1.651 m)   Wt 178 lb 1.6 oz (80.8 kg)   BMI 29.64 kg/m  CONSTITUTIONAL: Well-developed, well-nourished female in no acute distress.  PSYCHIATRIC: Normal mood and affect. Normal behavior. Normal judgment and thought content. NEUROLGIC: Alert and oriented to person, place, and time. Normal muscle tone coordination. No cranial nerve deficit noted. HENT:  Normocephalic, atraumatic,  External right and left ear normal. Oropharynx is clear and moist EYES: Conjunctivae and EOM are normal. No scleral icterus.  NECK: Normal range of motion, supple, no masses.  Normal thyroid.  SKIN: Skin is warm and dry. No rash noted. Not diaphoretic. No erythema. No pallor. CARDIOVASCULAR: Normal heart rate noted, regular rhythm, no murmur. RESPIRATORY: Clear to auscultation bilaterally. Effort and breath sounds normal, no problems with respiration noted. BREASTS: Symmetric in size. No masses, skin changes, nipple drainage, or lymphadenopathy. ABDOMEN: Soft, normal bowel sounds, no distention noted.  No tenderness, rebound or guarding.  BLADDER: Normal PELVIC:  External Genitalia: Normal  BUS: Urethral caruncle present  Vagina: Mildly atrophic; minimal secretions present; no odor  Cervix: Normal; no lesions: No cervical motion tenderness  Uterus: Normal; midplane, normal size and shape, mobile  Adnexa: Normal; nonpalpable and nontender  RV: External Exam NormaI, No Rectal Masses and Normal Sphincter tone  MUSCULOSKELETAL: Normal range of motion. No tenderness.  No cyanosis, clubbing, or edema.  2+ distal pulses. LYMPHATIC: No Axillary, Supraclavicular, or Inguinal Adenopathy.    Assessment:   Annual gynecologic examination 71 y.o. Contraception: abstinence bmi-29 Vaginal discharge, appears to be likely physiologic  Plan:  Pap: Not needed Mammogram: utd Stool Guaiac Testing:  colonoscopy 10/2016- polpy removed pre can- repeat 2/3 years Labs: thru pcp Routine preventative health maintenance measures emphasized: Exercise/Diet/Weight control, Tobacco Warnings and Alcohol/Substance use risks Nuswab to rule out BV Calcium with vitamin D 1200 mg a day is encouraged Return to Clinic - 1 Year   Alyssa Hooper, [place]' Alyssa HarmsMartin A Nate Common, MD  Note: This dictation was prepared with Dragon dictation along with smaller phrase technology. Any transcriptional errors that result from this  process are unintentional.

## 2017-07-21 LAB — NUSWAB BV AND CANDIDA, NAA
CANDIDA ALBICANS, NAA: NEGATIVE
CANDIDA GLABRATA, NAA: NEGATIVE

## 2018-03-01 ENCOUNTER — Other Ambulatory Visit: Payer: Self-pay | Admitting: Internal Medicine

## 2018-03-01 DIAGNOSIS — Z1231 Encounter for screening mammogram for malignant neoplasm of breast: Secondary | ICD-10-CM

## 2018-06-09 ENCOUNTER — Ambulatory Visit
Admission: RE | Admit: 2018-06-09 | Discharge: 2018-06-09 | Disposition: A | Discharge status: Home / Self Care | Payer: Medicare HMO | Source: Ambulatory Visit | Attending: Internal Medicine | Admitting: Internal Medicine

## 2018-06-09 DIAGNOSIS — Z1231 Encounter for screening mammogram for malignant neoplasm of breast: Secondary | ICD-10-CM | POA: Diagnosis present

## 2018-07-19 ENCOUNTER — Ambulatory Visit (INDEPENDENT_AMBULATORY_CARE_PROVIDER_SITE_OTHER): Payer: Medicare Other | Admitting: Obstetrics and Gynecology

## 2018-07-19 VITALS — BP 139/70 | HR 81 | Ht 65.0 in | Wt 176.2 lb

## 2018-07-19 DIAGNOSIS — Z01411 Encounter for gynecological examination (general) (routine) with abnormal findings: Secondary | ICD-10-CM

## 2018-07-19 DIAGNOSIS — Z780 Asymptomatic menopausal state: Secondary | ICD-10-CM | POA: Diagnosis not present

## 2018-07-19 DIAGNOSIS — Z1211 Encounter for screening for malignant neoplasm of colon: Secondary | ICD-10-CM

## 2018-07-19 DIAGNOSIS — N952 Postmenopausal atrophic vaginitis: Secondary | ICD-10-CM

## 2018-07-19 DIAGNOSIS — Z9889 Other specified postprocedural states: Secondary | ICD-10-CM

## 2018-07-19 NOTE — Progress Notes (Signed)
Pt is here for an annual exam.  ANNUAL PREVENTATIVE CARE-MEDICARE GYN  ENCOUNTER NOTE  Subjective:       Alyssa Hooper is a 72 y.o. 303P2012 female here for a routine annual gynecologic exam.  Current complaints: 1.  Medicare physical  No history of abnormal Pap smears. Not currently sexually active due to husband's advanced prostate cancer-stage IV Major interval health issues: Traumatic injury to left ankle, slowly healing   Gynecologic History No LMP recorded. Patient is postmenopausal. Contraception: abstinence Last Pap: 2015 wnl- no h/o abn. Results were: normal Last mammogram: 2019  BI-RADS 1  Menarche: Age 89 Menopause: Age 72 History of endometrial ablation age 72; history of short-term hormone replacement therapy with vaginal bleeding, medication discontinued and bleeding stopped  Obstetric History OB History  Gravida Para Term Preterm AB Living  3 2 2   1 2   SAB TAB Ectopic Multiple Live Births  1       2    # Outcome Date GA Lbr Len/2nd Weight Sex Delivery Anes PTL Lv  3 Term 1971   7 lb 9.6 oz (3.447 kg) F Vag-Spont   LIV  2 Term 1970   7 lb (3.175 kg) M Vag-Spont   LIV  1 SAB 1967            Past Medical History:  Diagnosis Date  . Acid reflux   . Anxiety   . Diabetes [facility](HCC)     Past Surgical History:  Procedure Laterality Date  . dysplasia    . FOOT SURGERY Left   . NOSE SURGERY      Current Outpatient Medications on File Prior to Visit  Medication Sig Dispense Refill  . ALPRAZolam (XANAX) 0.5 MG tablet     . glimepiride (AMARYL) 1 MG tablet     . levothyroxine (SYNTHROID, LEVOTHROID) 75 MCG tablet     . metFORMIN (GLUCOPHAGE) 500 MG tablet     . omeprazole (PRILOSEC) 40 MG capsule Take 40 mg by mouth daily.    . simvastatin (ZOCOR) 40 MG tablet     . traMADol (ULTRAM) 50 MG tablet Take by mouth as needed.    . traZODone (DESYREL) 100 MG tablet     . loratadine (CLARITIN) 10 MG tablet Take 10 mg by mouth daily.     No current  facility-administered medications on file prior to visit.     Allergies  Allergen Reactions  . Shellfish Allergy Anaphylaxis  . Chocolate   . Eggs Or Egg-Derived Products Other (See Comments)  . Lac Bovis Other (See Comments)    Lactose Intolerant, uses Silk.  Marland Kitchen. Penicillins Swelling  . Strawberry (Diagnostic) Hives    Social History   Socioeconomic History  . Marital status: Married    Spouse name: Not on file  . Number of children: Not on file  . Years of education: Not on file  . Highest education level: Not on file  Occupational History  . Not on file  Social Needs  . Financial resource strain: Not on file  . Food insecurity:    Worry: Not on file    Inability: Not on file  . Transportation needs:    Medical: Not on file    Non-medical: Not on file  Tobacco Use  . Smoking status: Never Smoker  . Smokeless tobacco: Never Used  Substance and Sexual Activity  . Alcohol use: No  . Drug use: No  . Sexual activity: Not Currently    Birth control/protection: Post-menopausal  Lifestyle  . Physical activity:    Days per week: Not on file    Minutes per session: Not on file  . Stress: Not on file  Relationships  . Social connections:    Talks on phone: Not on file    Gets together: Not on file    Attends religious service: Not on file    Active member of club or organization: Not on file    Attends meetings of clubs or organizations: Not on file    Relationship status: Not on file  . Intimate partner violence:    Fear of current or ex partner: Not on file    Emotionally abused: Not on file    Physically abused: Not on file    Forced sexual activity: Not on file  Other Topics Concern  . Not on file  Social History Narrative  . Not on file    Family History  Problem Relation Age of Onset  . Diabetes Mother   . Uterine cancer Mother   . Breast cancer Neg Hx   . Ovarian cancer Neg Hx   . Colon cancer Neg Hx   . Heart disease Neg Hx     The following  portions of the patient's history were reviewed and updated as appropriate: allergies, current medications, past family history, past medical history, past social history, past surgical history and problem list.  Review of Systems Review of Systems  Constitutional: Negative.   HENT: Negative.   Eyes: Negative.   Respiratory: Negative.   Cardiovascular: Negative.   Gastrointestinal: Negative.   Genitourinary:       Nocturia x2  Musculoskeletal: Positive for joint pain.       Left ankle injury 4 weeks ago, residual swelling  Skin: Negative.       Objective:   BP 139/70   Pulse 81   Ht 5\' 5"  (1.651 m)   Wt 176 lb 3 oz (79.9 kg)   BMI 29.32 kg/m  CONSTITUTIONAL: Well-developed, well-nourished female in no acute distress.  PSYCHIATRIC: Normal mood and affect. Normal behavior. Normal judgment and thought content. NEUROLGIC: Alert and oriented to person, place, and time. Normal muscle tone coordination. No cranial nerve deficit noted. HENT:  Normocephalic, atraumatic, External right and left ear normal.  EYES: Conjunctivae and EOM are normal. No scleral icterus.  NECK: Normal range of motion, supple, no masses.  Normal thyroid.  SKIN: Skin is warm and dry. No rash noted. Not diaphoretic. No erythema. No pallor. CARDIOVASCULAR: Normal heart rate noted, regular rhythm, no murmur. RESPIRATORY: Clear to auscultation bilaterally. Effort and breath sounds normal, no problems with respiration noted. BREASTS: Symmetric in size. No masses, skin changes, nipple drainage, or lymphadenopathy. ABDOMEN: Soft, normal bowel sounds, no distention noted.  No tenderness, rebound or guarding.  BLADDER: Normal PELVIC:  External Genitalia: Normal  BUS: Normal  Vagina: Mild to moderate atrophy  Cervix: Normal; no cervical motion tenderness  Uterus: Normal; midplane, normal size, mobile, nontender  Adnexa: Normal; nonpalpable nontender  RV: External Exam NormaI, No Rectal Masses and Normal Sphincter  tone  MUSCULOSKELETAL: Normal range of motion. No tenderness.  No cyanosis, clubbing, or edema.  2+ distal pulses. LYMPHATIC: No Axillary, Supraclavicular, or Inguinal Adenopathy.    Assessment:   Annual gynecologic examination 72 y.o. Medicare physical Contraception: post menopausal status BMI 29 Problem List Items Addressed This Visit      Other   History of endometrial ablation   Menopause - Primary    Vaginal atrophy, asymptomatic  Plan:  Pap: Not needed Mammogram: Already completed Stool Guaiac Testing:  Ordered Labs: Done through primary care Routine preventative health maintenance measures emphasized: Exercise/Diet/Weight control, Tobacco Warnings and Alcohol/Substance use risks Return to Clinic - 1 Year   Herold Harms, MD   Note: This dictation was prepared with Dragon dictation along with smaller phrase technology. Any transcriptional errors that result from this process are unintentional.

## 2018-07-19 NOTE — Patient Instructions (Addendum)
1.  Pap smear is not done.  No further Pap smears are needed 2.  Mammogram is up-to-date 3.  Stool guaiac cards are given for colon cancer screening 4.  Continue with healthy eating and exercise. 5.  Continue with calcium and vitamin D supplementation twice a day 6.  Return in 1 year for annual exam-Medicare physical   Health Maintenance for Postmenopausal Women Menopause is a normal process in which your reproductive ability comes to an end. This process happens gradually over a span of months to years, usually between the ages of 41 and 19. Menopause is complete when you have missed 12 consecutive menstrual periods. It is important to talk with your health care provider about some of the most common conditions that affect postmenopausal women, such as heart disease, cancer, and bone loss (osteoporosis). Adopting a healthy lifestyle and getting preventive care can help to promote your health and wellness. Those actions can also lower your chances of developing some of these common conditions. What should I know about menopause? During menopause, you may experience a number of symptoms, such as:  Moderate-to-severe hot flashes.  Night sweats.  Decrease in sex drive.  Mood swings.  Headaches.  Tiredness.  Irritability.  Memory problems.  Insomnia.  Choosing to treat or not to treat menopausal changes is an individual decision that you make with your health care provider. What should I know about hormone replacement therapy and supplements? Hormone therapy products are effective for treating symptoms that are associated with menopause, such as hot flashes and night sweats. Hormone replacement carries certain risks, especially as you become older. If you are thinking about using estrogen or estrogen with progestin treatments, discuss the benefits and risks with your health care provider. What should I know about heart disease and stroke? Heart disease, heart attack, and stroke become  more likely as you age. This may be due, in part, to the hormonal changes that your body experiences during menopause. These can affect how your body processes dietary fats, triglycerides, and cholesterol. Heart attack and stroke are both medical emergencies. There are many things that you can do to help prevent heart disease and stroke:  Have your blood pressure checked at least every 1-2 years. High blood pressure causes heart disease and increases the risk of stroke.  If you are 75-64 years old, ask your health care provider if you should take aspirin to prevent a heart attack or a stroke.  Do not use any tobacco products, including cigarettes, chewing tobacco, or electronic cigarettes. If you need help quitting, ask your health care provider.  It is important to eat a healthy diet and maintain a healthy weight. ? Be sure to include plenty of vegetables, fruits, low-fat dairy products, and lean protein. ? Avoid eating foods that are high in solid fats, added sugars, or salt (sodium).  Get regular exercise. This is one of the most important things that you can do for your health. ? Try to exercise for at least 150 minutes each week. The type of exercise that you do should increase your heart rate and make you sweat. This is known as moderate-intensity exercise. ? Try to do strengthening exercises at least twice each week. Do these in addition to the moderate-intensity exercise.  Know your numbers.Ask your health care provider to check your cholesterol and your blood glucose. Continue to have your blood tested as directed by your health care provider.  What should I know about cancer screening? There are several types of  cancer. Take the following steps to reduce your risk and to catch any cancer development as early as possible. Breast Cancer  Practice breast self-awareness. ? This means understanding how your breasts normally appear and feel. ? It also means doing regular breast  self-exams. Let your health care provider know about any changes, no matter how small.  If you are 46 or older, have a clinician do a breast exam (clinical breast exam or CBE) every year. Depending on your age, family history, and medical history, it may be recommended that you also have a yearly breast X-ray (mammogram).  If you have a family history of breast cancer, talk with your health care provider about genetic screening.  If you are at high risk for breast cancer, talk with your health care provider about having an MRI and a mammogram every year.  Breast cancer (BRCA) gene test is recommended for women who have family members with BRCA-related cancers. Results of the assessment will determine the need for genetic counseling and BRCA1 and for BRCA2 testing. BRCA-related cancers include these types: ? Breast. This occurs in males or females. ? Ovarian. ? Tubal. This may also be called fallopian tube cancer. ? Cancer of the abdominal or pelvic lining (peritoneal cancer). ? Prostate. ? Pancreatic.  Cervical, Uterine, and Ovarian Cancer Your health care provider may recommend that you be screened regularly for cancer of the pelvic organs. These include your ovaries, uterus, and vagina. This screening involves a pelvic exam, which includes checking for microscopic changes to the surface of your cervix (Pap test).  For women ages 21-65, health care providers may recommend a pelvic exam and a Pap test every three years. For women ages 58-65, they may recommend the Pap test and pelvic exam, combined with testing for human papilloma virus (HPV), every five years. Some types of HPV increase your risk of cervical cancer. Testing for HPV may also be done on women of any age who have unclear Pap test results.  Other health care providers may not recommend any screening for nonpregnant women who are considered low risk for pelvic cancer and have no symptoms. Ask your health care provider if a screening  pelvic exam is right for you.  If you have had past treatment for cervical cancer or a condition that could lead to cancer, you need Pap tests and screening for cancer for at least 20 years after your treatment. If Pap tests have been discontinued for you, your risk factors (such as having a new sexual partner) need to be reassessed to determine if you should start having screenings again. Some women have medical problems that increase the chance of getting cervical cancer. In these cases, your health care provider may recommend that you have screening and Pap tests more often.  If you have a family history of uterine cancer or ovarian cancer, talk with your health care provider about genetic screening.  If you have vaginal bleeding after reaching menopause, tell your health care provider.  There are currently no reliable tests available to screen for ovarian cancer.  Lung Cancer Lung cancer screening is recommended for adults 88-48 years old who are at high risk for lung cancer because of a history of smoking. A yearly low-dose CT scan of the lungs is recommended if you:  Currently smoke.  Have a history of at least 30 pack-years of smoking and you currently smoke or have quit within the past 15 years. A pack-year is smoking an average of one pack of  cigarettes per day for one year.  Yearly screening should:  Continue until it has been 15 years since you quit.  Stop if you develop a health problem that would prevent you from having lung cancer treatment.  Colorectal Cancer  This type of cancer can be detected and can often be prevented.  Routine colorectal cancer screening usually begins at age 76 and continues through age 5.  If you have risk factors for colon cancer, your health care provider may recommend that you be screened at an earlier age.  If you have a family history of colorectal cancer, talk with your health care provider about genetic screening.  Your health care  provider may also recommend using home test kits to check for hidden blood in your stool.  A small camera at the end of a tube can be used to examine your colon directly (sigmoidoscopy or colonoscopy). This is done to check for the earliest forms of colorectal cancer.  Direct examination of the colon should be repeated every 5-10 years until age 16. However, if early forms of precancerous polyps or small growths are found or if you have a family history or genetic risk for colorectal cancer, you may need to be screened more often.  Skin Cancer  Check your skin from head to toe regularly.  Monitor any moles. Be sure to tell your health care provider: ? About any new moles or changes in moles, especially if there is a change in a mole's shape or color. ? If you have a mole that is larger than the size of a pencil eraser.  If any of your family members has a history of skin cancer, especially at a young age, talk with your health care provider about genetic screening.  Always use sunscreen. Apply sunscreen liberally and repeatedly throughout the day.  Whenever you are outside, protect yourself by wearing long sleeves, pants, a wide-brimmed hat, and sunglasses.  What should I know about osteoporosis? Osteoporosis is a condition in which bone destruction happens more quickly than new bone creation. After menopause, you may be at an increased risk for osteoporosis. To help prevent osteoporosis or the bone fractures that can happen because of osteoporosis, the following is recommended:  If you are 31-47 years old, get at least 1,000 mg of calcium and at least 600 mg of vitamin D per day.  If you are older than age 78 but younger than age 57, get at least 1,200 mg of calcium and at least 600 mg of vitamin D per day.  If you are older than age 44, get at least 1,200 mg of calcium and at least 800 mg of vitamin D per day.  Smoking and excessive alcohol intake increase the risk of osteoporosis. Eat  foods that are rich in calcium and vitamin D, and do weight-bearing exercises several times each week as directed by your health care provider. What should I know about how menopause affects my mental health? Depression may occur at any age, but it is more common as you become older. Common symptoms of depression include:  Low or sad mood.  Changes in sleep patterns.  Changes in appetite or eating patterns.  Feeling an overall lack of motivation or enjoyment of activities that you previously enjoyed.  Frequent crying spells.  Talk with your health care provider if you think that you are experiencing depression. What should I know about immunizations? It is important that you get and maintain your immunizations. These include:  Tetanus, diphtheria,  and pertussis (Tdap) booster vaccine.  Influenza every year before the flu season begins.  Pneumonia vaccine.  Shingles vaccine.  Your health care provider may also recommend other immunizations. This information is not intended to replace advice given to you by your health care provider. Make sure you discuss any questions you have with your health care provider. Document Released: 02/05/2006 Document Revised: 07/03/2016 Document Reviewed: 09/17/2015 Elsevier Interactive Patient Education  2018 [employer].

## 2019-05-04 ENCOUNTER — Other Ambulatory Visit: Payer: Self-pay | Admitting: Internal Medicine

## 2019-05-04 DIAGNOSIS — Z1231 Encounter for screening mammogram for malignant neoplasm of breast: Secondary | ICD-10-CM

## 2019-07-26 ENCOUNTER — Encounter: Payer: Medicare Other | Admitting: Obstetrics and Gynecology

## 2020-08-30 ENCOUNTER — Other Ambulatory Visit: Payer: Self-pay | Admitting: Internal Medicine

## 2020-08-30 DIAGNOSIS — Z1231 Encounter for screening mammogram for malignant neoplasm of breast: Secondary | ICD-10-CM

## 2020-09-26 ENCOUNTER — Other Ambulatory Visit: Payer: Self-pay

## 2020-09-26 ENCOUNTER — Ambulatory Visit
Admission: RE | Admit: 2020-09-26 | Discharge: 2020-09-26 | Disposition: A | Discharge status: Home / Self Care | Payer: Medicare Other | Source: Ambulatory Visit | Attending: Internal Medicine | Admitting: Internal Medicine

## 2020-09-26 DIAGNOSIS — Z1231 Encounter for screening mammogram for malignant neoplasm of breast: Secondary | ICD-10-CM | POA: Diagnosis present

## 2021-07-29 ENCOUNTER — Other Ambulatory Visit: Payer: Self-pay | Admitting: Internal Medicine

## 2021-07-29 DIAGNOSIS — Z1231 Encounter for screening mammogram for malignant neoplasm of breast: Secondary | ICD-10-CM

## 2021-09-29 ENCOUNTER — Other Ambulatory Visit: Payer: Self-pay

## 2021-09-29 ENCOUNTER — Ambulatory Visit
Admission: RE | Admit: 2021-09-29 | Discharge: 2021-09-29 | Disposition: A | Discharge status: Home / Self Care | Payer: Medicare Other | Source: Ambulatory Visit | Attending: Internal Medicine | Admitting: Internal Medicine

## 2021-09-29 DIAGNOSIS — Z1231 Encounter for screening mammogram for malignant neoplasm of breast: Secondary | ICD-10-CM | POA: Diagnosis not present

## 2022-08-28 ENCOUNTER — Other Ambulatory Visit: Payer: Self-pay | Admitting: Internal Medicine

## 2022-12-27 IMAGING — MG MM DIGITAL SCREENING BILAT W/ TOMO AND CAD
6 of 10 series · 6 of 30 positions shown · non-contrast
Comparison: Previous exam(s).

CLINICAL DATA: Screening.

EXAM:
DIGITAL SCREENING BILATERAL MAMMOGRAM WITH TOMOSYNTHESIS AND CAD
TECHNIQUE: Bilateral screening digital craniocaudal and mediolateral oblique
mammograms were obtained. Bilateral screening digital breast
tomosynthesis was performed. The images were evaluated with
computer-aided detection.

[L CC synth-2D]
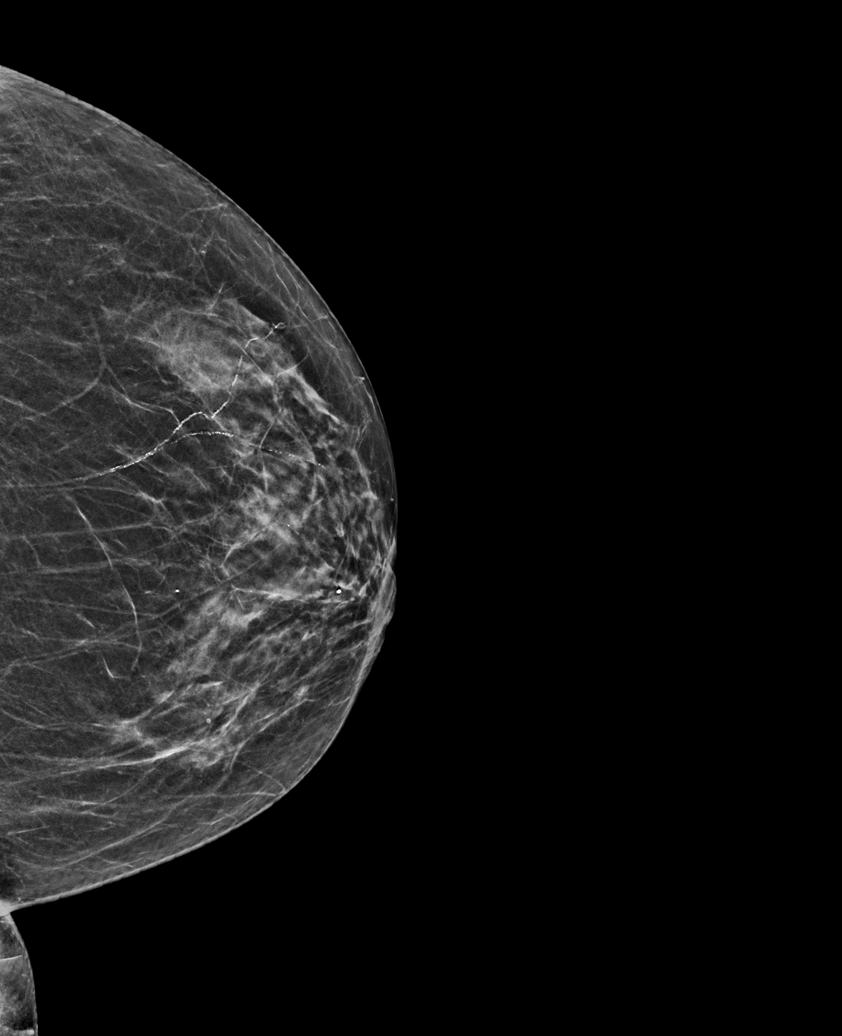

[R MLO synth-2D (1 of 2)]
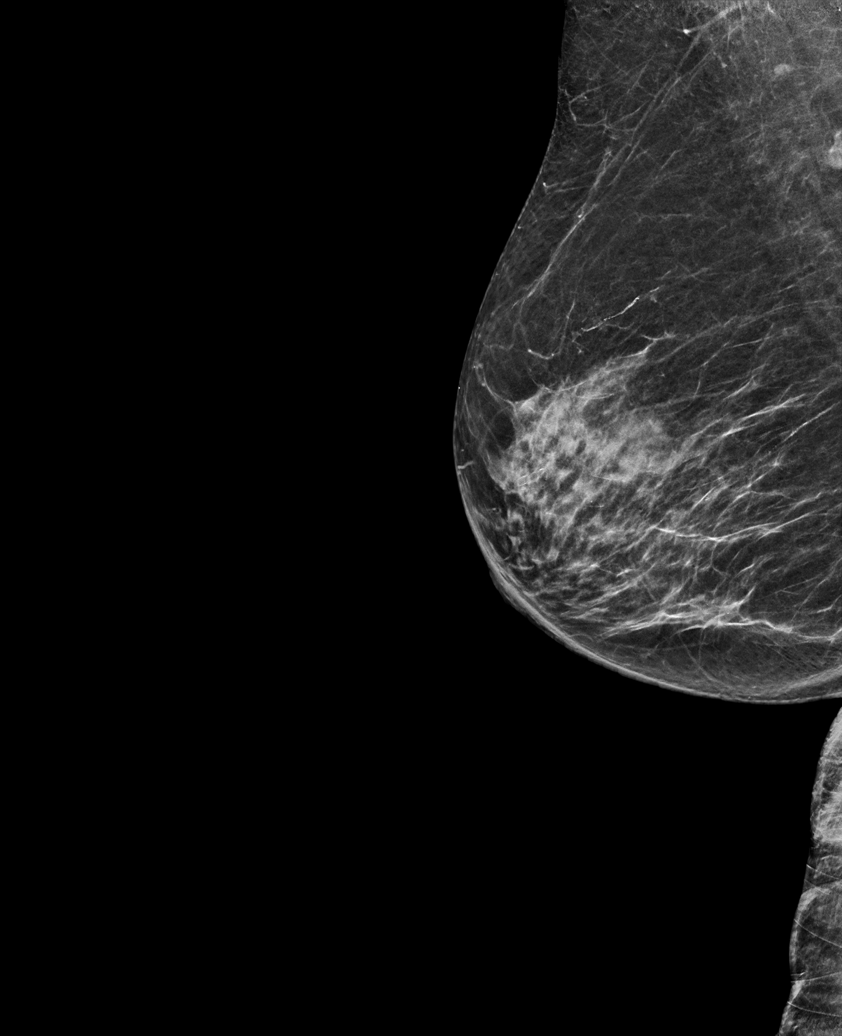

[R CC synth-2D]
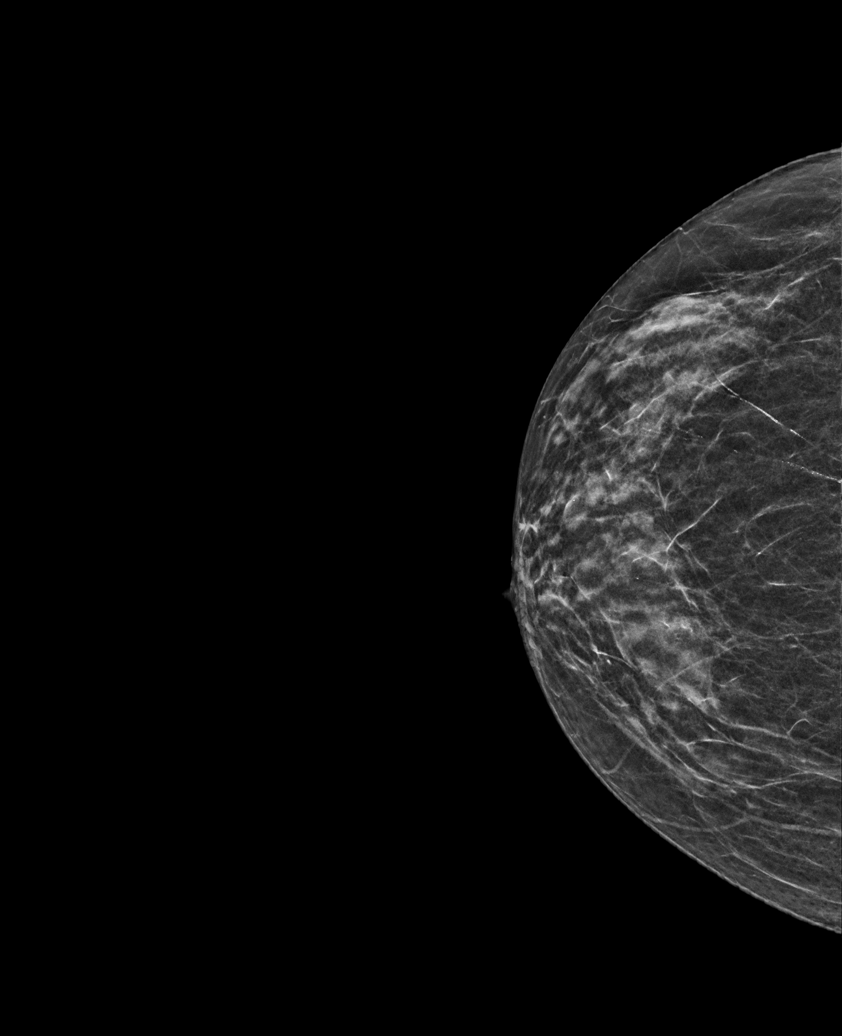

[R MLO synth-2D (2 of 2)]
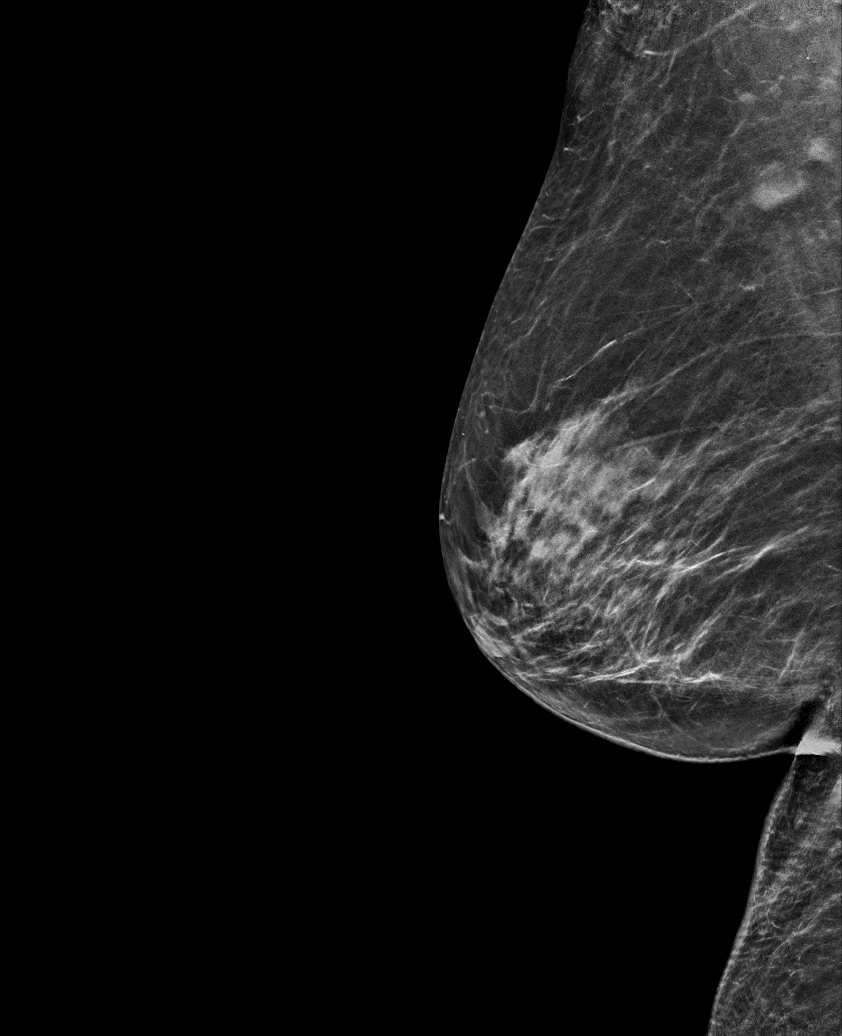

[L MLO synth-2D]
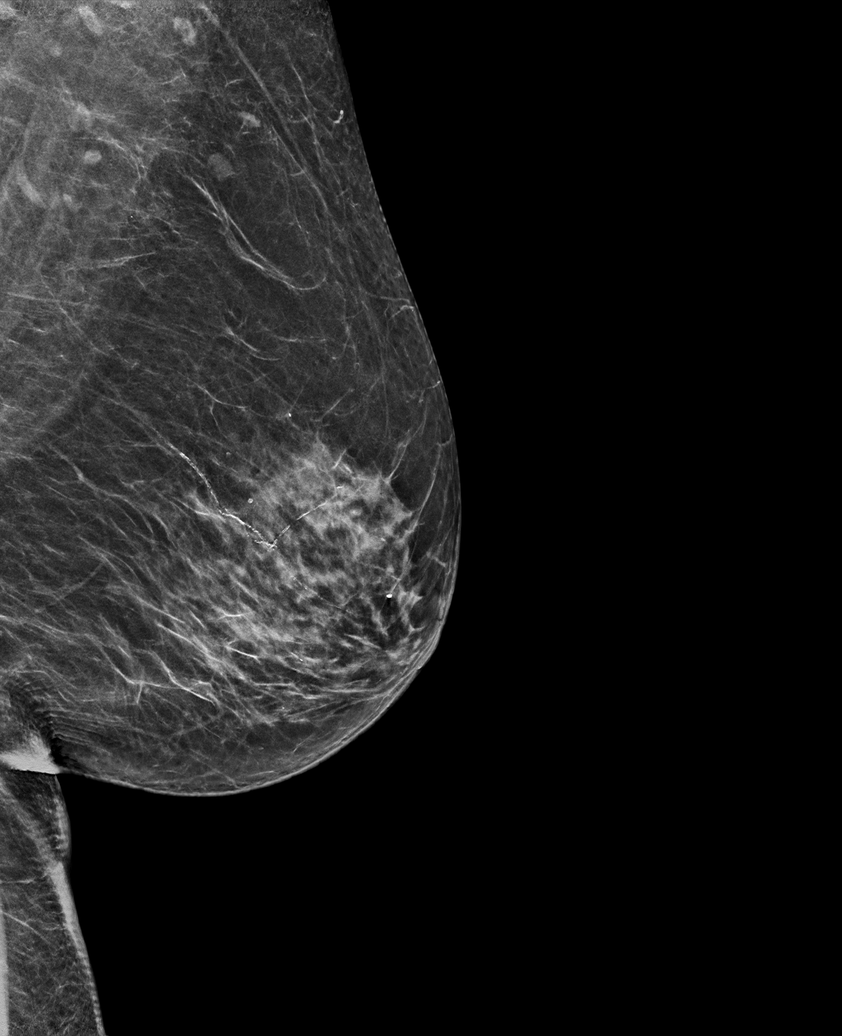

[R CC tomo · tomo slice 22/43.0]
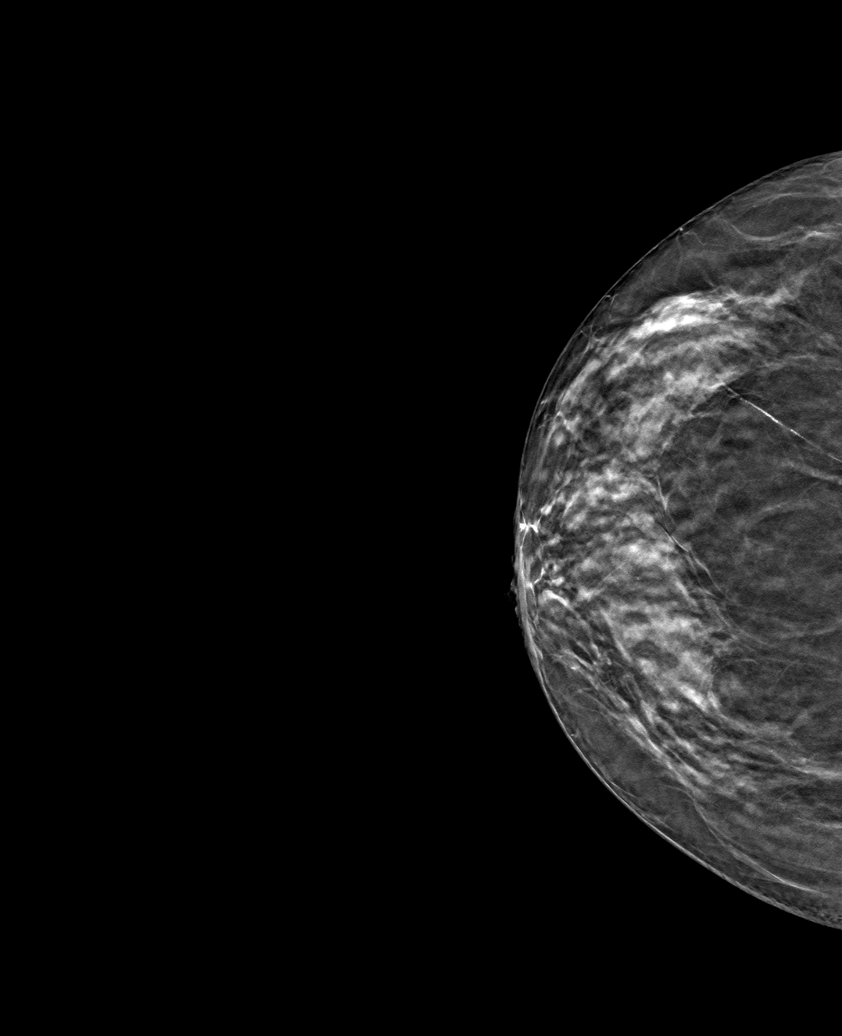

[6 of 30 positions shown; findings below may reference images not displayed]

ACR Breast Density Category c: The breast tissue is heterogeneously
dense, which may obscure small masses.
FINDINGS: There are no findings suspicious for malignancy.
IMPRESSION: No mammographic evidence of malignancy. A result letter of this
screening mammogram will be mailed directly to the patient.

RECOMMENDATION:
Screening mammogram in one year. (Code:Q3-W-BC3)

BI-RADS CATEGORY  1: Negative.

## 2024-12-28 DEATH — deceased
